# Patient Record
Sex: Male | Born: 2000 | Race: White | Hispanic: No | Marital: Single | Smoking: Never smoker
Health system: Southern US, Community
[De-identification: ages and names within clinical notes are randomized; demographics above are authoritative.]

## PROBLEM LIST (undated history)

## (undated) HISTORY — PX: SPLENECTOMY, TOTAL: SHX788

## (undated) HISTORY — PX: CHOLECYSTECTOMY: SHX55

## (undated) HISTORY — PX: TONSILLECTOMY: SUR1361

---
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
## 2018-12-16 ENCOUNTER — Emergency Department
Admission: EM | Admit: 2018-12-16 | Discharge: 2018-12-16 | Disposition: A | Discharge status: Home / Self Care | Payer: BC Managed Care – PPO | Attending: Emergency Medicine | Admitting: Emergency Medicine

## 2018-12-16 ENCOUNTER — Other Ambulatory Visit: Payer: Self-pay

## 2018-12-16 DIAGNOSIS — Z20828 Contact with and (suspected) exposure to other viral communicable diseases: Secondary | ICD-10-CM | POA: Insufficient documentation

## 2018-12-16 DIAGNOSIS — Z7189 Other specified counseling: Secondary | ICD-10-CM

## 2018-12-16 DIAGNOSIS — Z9081 Acquired absence of spleen: Secondary | ICD-10-CM | POA: Diagnosis not present

## 2018-12-16 DIAGNOSIS — Z1159 Encounter for screening for other viral diseases: Secondary | ICD-10-CM | POA: Diagnosis present

## 2018-12-16 NOTE — ED Triage Notes (Addendum)
Pt states he was at a part last night and was around a friend whose mother was tested positive for covid. 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 Pt currently does not have any sx.

## 2018-12-16 NOTE — ED Provider Notes (Signed)
[facility] Emergency Department Provider Note  ____________________________________________   None    (approximate)  I have reviewed the triage vital signs and the nursing notes.   HISTORY  Chief Complaint Covid test    HPI Alexander Vincent is a 18 y.o. male presents emergency department requesting a COVID-19 test.  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  He states he is not had any symptoms.  He does work in [employer].  He works for [employer].  He denies any chest pain or shortness of breath.    No past medical history on file.  There are no active problems to display for this patient.     Prior to Admission medications   Not on File    Allergies Zithromax [azithromycin]  No family history on file.  Social History Social History   Tobacco Use  . Smoking status: Never Smoker  . Smokeless tobacco: Never Used  Substance Use Topics  . Alcohol use: Yes  . Drug use: Not Currently    Review of Systems  Constitutional: No fever/chills Eyes: No visual changes. ENT: No sore throat. Respiratory: Denies cough Genitourinary: Negative for dysuria. Musculoskeletal: Negative for back pain. Skin: Negative for rash.    ____________________________________________   PHYSICAL EXAM:  VITAL SIGNS: ED Triage Vitals  Enc Vitals Group     BP 12/16/18 1702 (!) 147/86     Pulse Rate 12/16/18 1702 82     Resp 12/16/18 1702 18     Temp 12/16/18 1702 98.7 F (37.1 C)     Temp Source 12/16/18 1702 Oral     SpO2 12/16/18 1702 97 %     Weight 12/16/18 1703 (!) 325 lb (147.4 kg)     Height 12/16/18 1703 6' (1.829 m)     Head Circumference --      Peak Flow --      Pain Score 12/16/18 1651 0     Pain Loc --      Pain Edu? --      Excl. in [place]? --     Constitutional: Alert and oriented. Well appearing and in no acute distress. Eyes:  Conjunctivae are normal.  Head: Atraumatic. Nose: No congestion/rhinnorhea. Mouth/Throat: Mucous membranes are moist.   Neck:  supple no lymphadenopathy noted Cardiovascular: Normal rate, regular rhythm.  Respiratory: Normal respiratory effort.  No retractions,  GU: deferred Musculoskeletal: FROM all extremities, warm and well perfused Neurologic:  Normal speech and language.  Skin:  Skin is warm, dry and intact. No rash noted. Psychiatric: Mood and affect are normal. Speech and behavior are normal.  ____________________________________________   LABS (all labs ordered are listed, but only abnormal results are displayed)  Labs Reviewed  NOVEL CORONAVIRUS, NAA (HOSPITAL ORDER, SEND-OUT TO REF LAB)   ____________________________________________   ____________________________________________  RADIOLOGY    ____________________________________________   PROCEDURES  Procedure(s) performed: No  Procedures    ____________________________________________   INITIAL IMPRESSION / ASSESSMENT AND PLAN / ED COURSE  Pertinent labs & imaging results that were available during my care of the patient were reviewed by me and considered in my medical decision making (see chart for details).   Patient is 18 year old male presents emergency department requesting COVID-19 testing after being at a party for someone tested positive.  He states he is not have a spleen is very concerned.  He  has had no symptoms.  Physical exam patient appears well.  Basically unremarkable.  COVID-19 send out test ordered  Had a long lengthy discussion with the patient about his health status and his social recreation.  Explained to him that at this time with COVID-19 he would not have the ability to fight off the virus very well.  He should remain out of large crowds.  He should wear a mask when in public.  His responses "you only live once ".  Patient was given a work note to stay out of work until [employer]  testing is negative.  He states he understands my instructions.  He was discharged stable condition.     As part of my medical decision making, I reviewed the following data within the electronic MEDICAL RECORD NUMBER Nursing notes reviewed and incorporated, Old chart reviewed, Notes from prior ED visits and [place] Controlled Substance Database  ____________________________________________   FINAL CLINICAL IMPRESSION(S) / ED DIAGNOSES  Final diagnoses:  Educated About Covid-19 Virus Infection      NEW MEDICATIONS STARTED DURING THIS VISIT:  There are no discharge medications for this patient.    Note:  This document was prepared using Dragon voice recognition software and may include unintentional dictation errors.    Faythe Ghee, PA-C 12/16/18 1845    Emily Filbert, MD 12/16/18 2236

## 2018-12-16 NOTE — Discharge Instructions (Addendum)
STAY AT HOME UNTIL YOU HAVE A NEGATIVE TEST RESULT.  FOLLOW UP WITH THE HEALTH DEPARTMENT IF YOUR TEST IS POSITIVE.  ONCE YOU ARE NOT QUARANTINED, WEAR A MASK WHEN IN PUBLIC .  AVOID LARGE GATHERINGS

## 2018-12-18 LAB — NOVEL CORONAVIRUS, NAA (HOSP ORDER, SEND-OUT TO REF LAB; TAT 18-24 HRS): SARS-CoV-2, NAA: NOT DETECTED

## 2019-03-16 ENCOUNTER — Other Ambulatory Visit: Payer: Self-pay

## 2019-03-16 ENCOUNTER — Emergency Department
Admission: EM | Admit: 2019-03-16 | Discharge: 2019-03-16 | Disposition: A | Discharge status: Home / Self Care | Payer: BC Managed Care – PPO | Attending: Emergency Medicine | Admitting: Emergency Medicine

## 2019-03-16 DIAGNOSIS — R05 Cough: Secondary | ICD-10-CM | POA: Diagnosis not present

## 2019-03-16 DIAGNOSIS — R059 Cough, unspecified: Secondary | ICD-10-CM

## 2019-03-16 DIAGNOSIS — Z20828 Contact with and (suspected) exposure to other viral communicable diseases: Secondary | ICD-10-CM | POA: Insufficient documentation

## 2019-03-16 DIAGNOSIS — R509 Fever, unspecified: Secondary | ICD-10-CM | POA: Diagnosis not present

## 2019-03-16 DIAGNOSIS — R51 Headache: Secondary | ICD-10-CM | POA: Insufficient documentation

## 2019-03-16 DIAGNOSIS — R519 Headache, unspecified: Secondary | ICD-10-CM

## 2019-03-16 DIAGNOSIS — R0981 Nasal congestion: Secondary | ICD-10-CM | POA: Insufficient documentation

## 2019-03-16 MED ORDER — ZYRTEC ALLERGY 10 MG PO CAPS: 10.0000 mg | ORAL_CAPSULE | Freq: Every day | ORAL | 0 refills | Status: AC

## 2019-03-16 MED ORDER — FLUTICASONE PROPIONATE 50 MCG/ACT NA SUSP: 2.0000 | Freq: Every day | NASAL | 0 refills | Status: AC

## 2019-03-16 MED ORDER — DEXAMETHASONE SODIUM PHOSPHATE 10 MG/ML IJ SOLN
10.0000 mg | Freq: Once | INTRAMUSCULAR | Status: AC
  Administered 2019-03-16: 18:00:00 10 mg via INTRAMUSCULAR
  Filled 2019-03-16: qty 1

## 2019-03-16 NOTE — Discharge Instructions (Addendum)
You were seen today for headache, congestion and cough.  We have given you a steroid injection for your sinus pressure and headache.  I have given you prescriptions for Zyrtec and Flonase.  Can take ibuprofen or Tylenol as needed for headache.  We have tested you for COVID and will call you with those results.  You should remain in quarantine until you have been notified of the results.  Work note provided.

## 2019-03-16 NOTE — ED Triage Notes (Signed)
Pt presents via POV c/o headache, cough, congestion x2 days. Reports temp 99.8 at home.

## 2019-03-16 NOTE — ED Notes (Signed)
Pt verbalized understanding of discharge instructions. NAD at this time. 

## 2019-03-16 NOTE — ED Provider Notes (Signed)
[facility] Emergency Department Provider Note ____________________________________________  Time seen: 1726  I have reviewed the triage vital signs and the nursing notes.  HISTORY  Chief Complaint  Nasal Congestion and Cough   HPI Alexander Vincent is a 18 y.o. male presents to the clinic today with c/o headache, nasal congestion and cough. Symptoms started yesterday.  The headache is located in his forehead.  He describes the pain as pressure.  He denies associated dizziness or visual changes.  He is blowing clear/yellow mucus out of his nose.  He reports scratchy throat but no sore throat.  The cough is productive of clear/yellow mucus.  He denies shortness of breath.  He reports low-grade fever but denies chills or body aches.  He has had allergies in the past.  He denies exposure to COVID that he is aware of.  He has taken Alka-Seltzer with minimal relief.  History reviewed. No pertinent past medical history.  There are no active problems to display for this patient.   Past Surgical History:  Procedure Laterality Date  . SPLENECTOMY, TOTAL      Prior to Admission medications   Medication Sig Start Date End Date Taking? Authorizing Provider  Cetirizine HCl (ZYRTEC ALLERGY) 10 MG CAPS Take 1 capsule (10 mg total) by mouth daily. 03/16/19   Jearld Fenton, NP  fluticasone (FLONASE) 50 MCG/ACT nasal spray Place 2 sprays into both nostrils daily. 03/16/19   Jearld Fenton, NP    Allergies Zithromax [azithromycin]  History reviewed. No pertinent family history.  Social History Social History   Tobacco Use  . Smoking status: Never Smoker  . Smokeless tobacco: Never Used  Substance Use Topics  . Alcohol use: Yes  . Drug use: Not Currently    Review of Systems  Constitutional: Positive for fever.  Denies chills or body aches ENT: Positive for nasal congestion.  Negative for runny nose, ear pain or sore throat. Cardiovascular: Negative for  chest pain or chest tightness. Respiratory: Positive for cough.  Negative for shortness of breath. Gastrointestinal: Negative for abdominal pain, nausea, vomiting and diarrhea. Skin: Negative for rash. Neurological: Positive for headache.  Negative for  focal weakness or numbness. ____________________________________________  PHYSICAL EXAM:  VITAL SIGNS: ED Triage Vitals  Enc Vitals Group     BP 03/16/19 1705 (!) 159/93     Pulse Rate 03/16/19 1705 (!) 113     Resp 03/16/19 1705 14     Temp 03/16/19 1705 98.9 F (37.2 C)     Temp Source 03/16/19 1705 Oral     SpO2 03/16/19 1705 95 %     Weight 03/16/19 1706 (!) 330 lb (149.7 kg)     Height 03/16/19 1706 6' (1.829 m)     Head Circumference --      Peak Flow --      Pain Score 03/16/19 1706 6     Pain Loc --      Pain Edu? --      Excl. in [place]? --     Constitutional: Alert and oriented. Well appearing and in no distress. Head: Normocephalic, no frontal or maxillary sinus tenderness noted. Eyes: Conjunctivae are normal. PERRL. Normal extraocular movements Ears: Canals clear. TMs intact bilaterally. Nose: Turbinates erythematous. Mouth/Throat: Mucous membranes are moist.  + PND noted. Hematological/Lymphatic/Immunological: No cervical lymphadenopathy. Cardiovascular: Normal rate, regular rhythm.  Respiratory: Normal respiratory effort. No wheezes/rales/rhonchi. Neurologic:  Normal gait without ataxia. Normal speech and language. No gross focal neurologic deficits are  appreciated. Skin:  Skin is warm, dry and intact. No rash noted. ________________________________   LABS  Lab Orders     SARS CORONAVIRUS 2 Nasal Swab Aptima Multi Swab  ____________________________________________    INITIAL IMPRESSION / ASSESSMENT AND PLAN / ED COURSE  Fever, Sinus Headache, Nasal Congestion and Cough:  Likely viral sinusitis, allergies vs COVID Decadron 10 mg IM today Rx for Flonase 1 spray twice daily x3 days then daily  thereafter Rx for Zyrtec 10 mg daily x1 week Test in 4 COVID completed in ER, will call you with results ____________________________________________  FINAL CLINICAL IMPRESSION(S) / ED DIAGNOSES  Final diagnoses:  Sinus headache  Nasal congestion  Cough  Fever, unspecified fever cause   Nicki Reaperegina Baity, NP    Lorre MunroeBaity, Regina W, NP 03/16/19 1745    Dionne BucySiadecki, Sebastian, MD 03/17/19 [phone]

## 2019-03-17 LAB — SARS CORONAVIRUS 2 (TAT 6-24 HRS): SARS Coronavirus 2: NEGATIVE

## 2019-06-16 ENCOUNTER — Other Ambulatory Visit: Payer: Self-pay

## 2019-06-16 DIAGNOSIS — R1110 Vomiting, unspecified: Secondary | ICD-10-CM | POA: Diagnosis present

## 2019-06-16 DIAGNOSIS — Z79899 Other long term (current) drug therapy: Secondary | ICD-10-CM | POA: Insufficient documentation

## 2019-06-16 DIAGNOSIS — K529 Noninfective gastroenteritis and colitis, unspecified: Secondary | ICD-10-CM | POA: Insufficient documentation

## 2019-06-16 DIAGNOSIS — R1013 Epigastric pain: Secondary | ICD-10-CM | POA: Insufficient documentation

## 2019-06-16 LAB — COMPREHENSIVE METABOLIC PANEL
ALT: 52 U/L — ABNORMAL HIGH (ref 0–44)
AST: 29 U/L (ref 15–41)
Albumin: 4.7 g/dL (ref 3.5–5.0)
Alkaline Phosphatase: 46 U/L (ref 38–126)
Anion gap: 9 (ref 5–15)
BUN: 13 mg/dL (ref 6–20)
CO2: 24 mmol/L (ref 22–32)
Calcium: 9.5 mg/dL (ref 8.9–10.3)
Chloride: 108 mmol/L (ref 98–111)
Creatinine, Ser: 0.79 mg/dL (ref 0.61–1.24)
GFR calc Af Amer: >60 mL/min (ref >60)
GFR calc non Af Amer: >60 mL/min (ref >60)
Glucose, Bld: 118 mg/dL — ABNORMAL HIGH (ref 70–99)
Potassium: 3.8 mmol/L (ref 3.5–5.1)
Sodium: 141 mmol/L (ref 135–145)
Total Bilirubin: 0.8 mg/dL (ref 0.3–1.2)
Total Protein: 7.8 g/dL (ref 6.5–8.1)

## 2019-06-16 LAB — URINALYSIS, COMPLETE (UACMP) WITH MICROSCOPIC
Bacteria, UA: NONE SEEN
Bilirubin Urine: NEGATIVE
Glucose, UA: NEGATIVE mg/dL
Hgb urine dipstick: NEGATIVE
Ketones, ur: NEGATIVE mg/dL
Leukocytes,Ua: NEGATIVE
Nitrite: NEGATIVE
Protein, ur: NEGATIVE mg/dL
Specific Gravity, Urine: 1.025 (ref 1.005–1.030)
pH: 5 (ref 5.0–8.0)

## 2019-06-16 LAB — CBC
HCT: 45.2 % (ref 39.0–52.0)
Hemoglobin: 16.2 g/dL (ref 13.0–17.0)
MCH: 30.3 pg (ref 26.0–34.0)
MCHC: 35.8 g/dL (ref 30.0–36.0)
MCV: 84.5 fL (ref 80.0–100.0)
Platelets: 620 10*3/uL — ABNORMAL HIGH (ref 150–400)
RBC: 5.35 MIL/uL (ref 4.22–5.81)
RDW: 12.6 % (ref 11.5–15.5)
WBC: 12.2 10*3/uL — ABNORMAL HIGH (ref 4.0–10.5)
nRBC: 0 % (ref 0.0–0.2)

## 2019-06-16 LAB — LIPASE, BLOOD: Lipase: 26 U/L (ref 11–51)

## 2019-06-16 NOTE — ED Triage Notes (Signed)
Pt states vomiting and diarrhea with generalized abd pain since 1400 today. Pt denies known fever. Pt with emesis x4 and diarrhea x2. Pt in no acute distress.

## 2019-06-17 ENCOUNTER — Emergency Department
Admission: EM | Admit: 2019-06-17 | Discharge: 2019-06-17 | Disposition: A | Discharge status: Home / Self Care | Payer: BC Managed Care – PPO | Attending: Emergency Medicine | Admitting: Emergency Medicine

## 2019-06-17 DIAGNOSIS — K529 Noninfective gastroenteritis and colitis, unspecified: Secondary | ICD-10-CM

## 2019-06-17 MED ORDER — ONDANSETRON 4 MG PO TBDP: ORAL_TABLET | ORAL | 0 refills | Status: AC

## 2019-06-17 MED ORDER — ONDANSETRON 4 MG PO TBDP
ORAL_TABLET | ORAL | Status: AC
  Administered 2019-06-17: 4 mg via ORAL
  Filled 2019-06-17: qty 1

## 2019-06-17 MED ORDER — ONDANSETRON 4 MG PO TBDP
4.0000 mg | ORAL_TABLET | Freq: Once | ORAL | Status: AC
  Administered 2019-06-17: 05:00:00 4 mg via ORAL

## 2019-06-17 NOTE — ED Provider Notes (Signed)
[facility] Emergency Department Provider Note  ____________________________________________   First MD Initiated Contact with Patient 06/17/19 [phone]     (approximate)  I have reviewed the triage vital signs and the nursing notes.   HISTORY  Chief Complaint Emesis and Diarrhea    HPI Alexander Vincent is a 18 y.o. male with history of obesity but otherwise no chronic medical issues.  He presents for evaluation after multiple episodes of vomiting and diarrhea earlier today.  He says that the symptoms started soon as he woke up this morning.   He says the symptoms have been severe but intermittent.  He was feeling a little bit better after coming to the emergency department earlier this afternoon so he decided to leave without being seen.  However he went home and ate some [place] food and immediately threw up again so he came back to the ED.  He has been waiting in the lobby for almost 8 hours and has had no more vomiting.  He had a "semisoft" bowel movement.  He is not really having any pain except for some discomfort in his epigastrium that is worse when he vomits.  He denies fever/chills, sore throat, chest pain, cough, shortness of breath.  No dysuria.  No one else in the family has had symptoms.  No alcohol use.        No past medical history on file.  There are no active problems to display for this patient.   Past Surgical History:  Procedure Laterality Date  . SPLENECTOMY, TOTAL      Prior to Admission medications   Medication Sig Start Date End Date Taking? Authorizing Provider  Cetirizine HCl (ZYRTEC ALLERGY) 10 MG CAPS Take 1 capsule (10 mg total) by mouth daily. 03/16/19   Jearld Fenton, NP  fluticasone (FLONASE) 50 MCG/ACT nasal spray Place 2 sprays into both nostrils daily. 03/16/19   Jearld Fenton, NP  ondansetron (ZOFRAN ODT) 4 MG disintegrating tablet Allow 1-2 tablets to dissolve in your mouth every 8 hours as needed for  nausea/vomiting 06/17/19   Hinda Kehr, MD    Allergies Zithromax [azithromycin]  No family history on file.  Social History Social History   Tobacco Use  . Smoking status: Never Smoker  . Smokeless tobacco: Never Used  Substance Use Topics  . Alcohol use: Yes  . Drug use: Not Currently    Review of Systems Constitutional: No fever/chills Eyes: No visual changes. ENT: No sore throat. Cardiovascular: Denies chest pain. Respiratory: Denies shortness of breath. Gastrointestinal: Nausea, vomiting, and diarrhea.  Some epigastric discomfort associated with the vomiting. Genitourinary: Negative for dysuria. Musculoskeletal: Negative for neck pain.  Negative for back pain. Integumentary: Negative for rash. Neurological: Negative for headaches, focal weakness or numbness.   ____________________________________________   PHYSICAL EXAM:  VITAL SIGNS: ED Triage Vitals [06/16/19 2058]  Enc Vitals Group     BP (!) 179/93     Pulse Rate 100     Resp 18     Temp 99 F (37.2 C)     Temp Source Oral     SpO2 100 %     Weight (!) 156.5 kg (345 lb)     Height 1.829 m (6')     Head Circumference      Peak Flow      Pain Score 4     Pain Loc      Pain Edu?      Excl. in [place]?  Constitutional: Alert and oriented.  Well-appearing and in no distress, ambulating without any difficulty. Eyes: Conjunctivae are normal.  Head: Atraumatic. Nose: No congestion/rhinnorhea. Mouth/Throat: Patient is wearing a mask. Neck: No stridor.  No meningeal signs.   Cardiovascular: Normal rate, regular rhythm. Good peripheral circulation. Grossly normal heart sounds. Respiratory: Normal respiratory effort.  No retractions. Gastrointestinal: Obese.  Soft and nondistended abdomen.  Mild tenderness to palpation of the epigastrium, no right upper quadrant tenderness, negative Murphy sign.  No lower abdominal tenderness.  No rebound and no guarding. Musculoskeletal: No lower extremity tenderness  nor edema. No gross deformities of extremities. Neurologic:  Normal speech and language. No gross focal neurologic deficits are appreciated.  Skin:  Skin is warm, dry and intact. Psychiatric: Mood and affect are normal. Speech and behavior are normal.  ____________________________________________   LABS (all labs ordered are listed, but only abnormal results are displayed)  Labs Reviewed  COMPREHENSIVE METABOLIC PANEL - Abnormal; Notable for the following components:      Result Value   Glucose, Bld 118 (*)    ALT 52 (*)    All other components within normal limits  CBC - Abnormal; Notable for the following components:   WBC 12.2 (*)    Platelets 620 (*)    All other components within normal limits  URINALYSIS, COMPLETE (UACMP) WITH MICROSCOPIC - Abnormal; Notable for the following components:   Color, Urine YELLOW (*)    APPearance CLEAR (*)    All other components within normal limits  LIPASE, BLOOD   ____________________________________________  EKG  None - EKG not ordered by ED physician ____________________________________________  RADIOLOGY Marylou MccoyI, Kayra Crowell, personally viewed and evaluated these images (plain radiographs) as part of my medical decision making, as well as reviewing the written report by the radiologist.  ED MD interpretation: No indication for emergent imaging  Official radiology report(s): No results found.  ____________________________________________   PROCEDURES   Procedure(s) performed (including Critical Care):  Procedures   ____________________________________________   INITIAL IMPRESSION / MDM / ASSESSMENT AND PLAN / ED COURSE  As part of my medical decision making, I reviewed the following data within the electronic MEDICAL RECORD NUMBER Nursing notes reviewed and incorporated, Labs reviewed , Old chart reviewed and Notes from prior ED visits   Differential diagnosis includes, but is not limited to, viral gastroenteritis, foodborne  pathogen, acute intra-abdominal infection such as appendicitis, biliary colic or cholecystitis, diverticulitis.  The patient is well-appearing and in no distress.  He has no tachycardia and is ambulating without difficulty.  Afebrile, well-appearing and in no distress.  Minimal tenderness to palpation of the epigastrium likely related to his multiple episodes of vomiting but no right upper quadrant tenderness and negative Murphy sign.  Very mild leukocytosis of 12 which is nonspecific particularly in a 18 year old, and he has normal LFTs and a normal lipase.  He is tolerating oral intake in the ED after Zofran 4 mg ODT and is appropriate for discharge and outpatient follow-up.  No indication for emergent imaging.  I am prescribing Zofran and I had my usual customary gastroenteritis discussion and return precautions.  He understands and agrees with the plan.          ____________________________________________  FINAL CLINICAL IMPRESSION(S) / ED DIAGNOSES  Final diagnoses:  Gastroenteritis     MEDICATIONS GIVEN DURING THIS VISIT:  Medications  ondansetron (ZOFRAN-ODT) disintegrating tablet 4 mg (4 mg Oral Given 06/17/19 0455)     ED Discharge Orders  Ordered    ondansetron (ZOFRAN ODT) 4 MG disintegrating tablet     06/17/19 0449          *Please note:  Alexander Vincent was evaluated in Emergency Department on 06/17/2019 for the symptoms described in the history of present illness. He was evaluated in the context of the global COVID-19 pandemic, which necessitated consideration that the patient might be at risk for infection with the SARS-CoV-2 virus that causes COVID-19. Institutional protocols and algorithms that pertain to the evaluation of patients at risk for COVID-19 are in a state of rapid change based on information released by regulatory bodies including the CDC and federal and state organizations. These policies and algorithms were followed during the  patient's care in the ED.  Some ED evaluations and interventions may be delayed as a result of limited staffing during the pandemic.*  Note:  This document was prepared using Dragon voice recognition software and may include unintentional dictation errors.   Loleta Rose, MD 06/17/19 [phone]

## 2019-06-17 NOTE — ED Notes (Signed)
No peripheral IV placed this visit.   Discharge instructions reviewed with patient. Questions fielded by this RN. Patient verbalizes understanding of instructions. Patient discharged home in stable condition per forbach. No acute distress noted at time of discharge.   

## 2019-06-17 NOTE — ED Notes (Addendum)
ED Provider at bedside.  Pt c/o of N and V x 3, D x 3 that started yesterday at 54 when pt woke (pt works 3rd shift at [employer]), pt denies chronic med issues or exposure to others that are sick, others in household ate same food without feeling ill  Pain to moderate palpation at epigastric area  Pt coached on bland diet and resting GI system

## 2019-06-17 NOTE — Discharge Instructions (Signed)

## 2019-06-17 NOTE — ED Notes (Signed)
Family at bedside. 

## 2022-02-02 ENCOUNTER — Telehealth: Payer: Self-pay

## 2022-02-02 NOTE — Telephone Encounter (Signed)
Called pt to assist with scheduling a PCP under the Managed Medicaid program. Left message to call back to [phone].

## 2022-06-25 DIAGNOSIS — R1013 Epigastric pain: Secondary | ICD-10-CM | POA: Diagnosis not present

## 2022-06-25 DIAGNOSIS — R112 Nausea with vomiting, unspecified: Secondary | ICD-10-CM | POA: Diagnosis not present

## 2022-06-25 DIAGNOSIS — R197 Diarrhea, unspecified: Secondary | ICD-10-CM | POA: Diagnosis not present

## 2022-10-28 ENCOUNTER — Emergency Department
Admission: EM | Admit: 2022-10-28 | Discharge: 2022-10-28 | Disposition: A | Discharge status: Home / Self Care | Payer: Medicaid Other | Attending: Emergency Medicine | Admitting: Emergency Medicine

## 2022-10-28 ENCOUNTER — Emergency Department: Payer: Medicaid Other

## 2022-10-28 ENCOUNTER — Other Ambulatory Visit: Payer: Self-pay

## 2022-10-28 DIAGNOSIS — K8689 Other specified diseases of pancreas: Secondary | ICD-10-CM | POA: Diagnosis not present

## 2022-10-28 DIAGNOSIS — M5459 Other low back pain: Secondary | ICD-10-CM | POA: Diagnosis not present

## 2022-10-28 DIAGNOSIS — D72829 Elevated white blood cell count, unspecified: Secondary | ICD-10-CM | POA: Insufficient documentation

## 2022-10-28 DIAGNOSIS — N390 Urinary tract infection, site not specified: Secondary | ICD-10-CM | POA: Diagnosis not present

## 2022-10-28 DIAGNOSIS — R1032 Left lower quadrant pain: Secondary | ICD-10-CM | POA: Diagnosis present

## 2022-10-28 DIAGNOSIS — M545 Low back pain, unspecified: Secondary | ICD-10-CM

## 2022-10-28 LAB — URINALYSIS, ROUTINE W REFLEX MICROSCOPIC
Bacteria, UA: NONE SEEN
Bilirubin Urine: NEGATIVE
Glucose, UA: NEGATIVE mg/dL
Hgb urine dipstick: NEGATIVE
Ketones, ur: 5 mg/dL — AB
Nitrite: NEGATIVE
Protein, ur: NEGATIVE mg/dL
Specific Gravity, Urine: 1.03 (ref 1.005–1.030)
pH: 5 (ref 5.0–8.0)

## 2022-10-28 LAB — CBC
HCT: 47.9 % (ref 39.0–52.0)
Hemoglobin: 16.8 g/dL (ref 13.0–17.0)
MCH: 30.5 pg (ref 26.0–34.0)
MCHC: 35.1 g/dL (ref 30.0–36.0)
MCV: 86.9 fL (ref 80.0–100.0)
Platelets: 572 10*3/uL — ABNORMAL HIGH (ref 150–400)
RBC: 5.51 MIL/uL (ref 4.22–5.81)
RDW: 12.2 % (ref 11.5–15.5)
WBC: 11.2 10*3/uL — ABNORMAL HIGH (ref 4.0–10.5)
nRBC: 0 % (ref 0.0–0.2)

## 2022-10-28 LAB — BASIC METABOLIC PANEL
Anion gap: 10 (ref 5–15)
BUN: 14 mg/dL (ref 6–20)
CO2: 22 mmol/L (ref 22–32)
Calcium: 9.6 mg/dL (ref 8.9–10.3)
Chloride: 108 mmol/L (ref 98–111)
Creatinine, Ser: 0.89 mg/dL (ref 0.61–1.24)
GFR, Estimated: >60 mL/min (ref >60)
Glucose, Bld: 87 mg/dL (ref 70–99)
Potassium: 3.7 mmol/L (ref 3.5–5.1)
Sodium: 140 mmol/L (ref 135–145)

## 2022-10-28 LAB — HEPATIC FUNCTION PANEL
ALT: 48 U/L — ABNORMAL HIGH (ref 0–44)
AST: 31 U/L (ref 15–41)
Albumin: 5.1 g/dL — ABNORMAL HIGH (ref 3.5–5.0)
Alkaline Phosphatase: 41 U/L (ref 38–126)
Bilirubin, Direct: 0.2 mg/dL (ref 0.0–0.2)
Indirect Bilirubin: 1.3 mg/dL — ABNORMAL HIGH (ref 0.3–0.9)
Total Bilirubin: 1.5 mg/dL — ABNORMAL HIGH (ref 0.3–1.2)
Total Protein: 8.1 g/dL (ref 6.5–8.1)

## 2022-10-28 LAB — CHLAMYDIA/NGC RT PCR (ARMC ONLY)
Chlamydia Tr: NOT DETECTED
N gonorrhoeae: NOT DETECTED

## 2022-10-28 LAB — LIPASE, BLOOD: Lipase: 23 U/L (ref 11–51)

## 2022-10-28 MED ORDER — HYDROCODONE-ACETAMINOPHEN 5-325 MG PO TABS: 2.0000 | ORAL_TABLET | Freq: Four times a day (QID) | ORAL | 0 refills | Status: AC | PRN

## 2022-10-28 MED ORDER — CEPHALEXIN 500 MG PO CAPS: 500.0000 mg | ORAL_CAPSULE | Freq: Two times a day (BID) | ORAL | 0 refills | Status: AC

## 2022-10-28 MED ORDER — KETOROLAC TROMETHAMINE 30 MG/ML IJ SOLN
60.0000 mg | Freq: Once | INTRAMUSCULAR | Status: AC
  Administered 2022-10-28: 60 mg via INTRAMUSCULAR
  Filled 2022-10-28: qty 2

## 2022-10-28 MED ORDER — ONDANSETRON 4 MG PO TBDP: 4.0000 mg | ORAL_TABLET | Freq: Once | ORAL | Status: DC

## 2022-10-28 MED ORDER — IBUPROFEN 800 MG PO TABS: 800.0000 mg | ORAL_TABLET | Freq: Three times a day (TID) | ORAL | 0 refills | Status: AC | PRN

## 2022-10-28 MED ORDER — CEPHALEXIN 500 MG PO CAPS
500.0000 mg | ORAL_CAPSULE | Freq: Once | ORAL | Status: AC
  Administered 2022-10-28: 500 mg via ORAL
  Filled 2022-10-28: qty 1

## 2022-10-28 NOTE — Discharge Instructions (Addendum)
Please go to the following website to schedule new (and existing) patient appointments:   [email or web address]   The following is a list of primary care offices in the area who are accepting new patients at this time.  Please reach out to one of them directly and let them know you would like to schedule an appointment to follow up on an Emergency Department visit, and/or to establish a new primary care provider (PCP).  There are likely other primary care clinics in the are who are accepting new patients, but this is an excellent place to start:  [facility] Lead physician: Dr Angela Bacigalupo [address] [place], [place] [address] [phone]  [facility] Lead Physician: Dr Krichna Sowles [address], [place], [place] [address] [phone]  [facility]  Lead Physician: Dr Megan Johnson [address], [place], [place] [address] [phone]  [facility] [place] [facility] Lead Physician: Dr Alex Karamalegos [address], [place], [place] [address] [phone]  [facility] [place] Lead Physician: Dr Laura Berglund [address], [place],  [address] [phone]   

## 2022-10-28 NOTE — ED Triage Notes (Addendum)
Pt to ED via POV c/o left sided flank pain. Pt reports pain started around 0430pm today. Pt endorses some nausea. Denies any urinary sx, vomiting, diarrhea, CP, SOB. NAD at this time

## 2022-10-28 NOTE — ED Notes (Signed)
Pt provided a diet soda.

## 2022-10-28 NOTE — ED Provider Notes (Signed)
[facility] Provider Note    Event Date/Time   First MD Initiated Contact with Patient 10/28/22 0131     (approximate)   History   Flank Pain   HPI  Alexander Vincent is a 22 y.o. male with history of cholecystectomy, splenectomy who presents to the emergency department left lower abdominal pain with nausea.  Pain progressively worsening.  Feels like he is having hard time urinating but no dysuria or hematuria.  No penile discharge.  No previous history of kidney stone.  No vomiting, diarrhea.  No testicular pain or swelling.  There is some pain with movement but he denies any known injury.  No numbness, tingling, weakness, incontinence.  No fever.   History provided by patient.    History reviewed. No pertinent past medical history.  Past Surgical History:  Procedure Laterality Date   CHOLECYSTECTOMY     SPLENECTOMY, TOTAL     TONSILLECTOMY      MEDICATIONS:  Prior to Admission medications   Medication Sig Start Date End Date Taking? Authorizing Provider  Cetirizine HCl (ZYRTEC ALLERGY) 10 MG CAPS Take 1 capsule (10 mg total) by mouth daily. 03/16/19   Lorre MunroeBaity, Regina W, NP  fluticasone (FLONASE) 50 MCG/ACT nasal spray Place 2 sprays into both nostrils daily. 03/16/19   Lorre MunroeBaity, Regina W, NP  ondansetron (ZOFRAN ODT) 4 MG disintegrating tablet Allow 1-2 tablets to dissolve in your mouth every 8 hours as needed for nausea/vomiting 06/17/19   Loleta RoseForbach, Cory, MD    Physical Exam   Triage Vital Signs: ED Triage Vitals [10/28/22 0100]  Enc Vitals Group     BP (!) 141/88     Pulse Rate 85     Resp 18     Temp 98.2 F (36.8 C)     Temp Source Oral     SpO2 96 %     Weight (!) 306 lb (138.8 kg)     Height 6' (1.829 m)     Head Circumference      Peak Flow      Pain Score 3     Pain Loc      Pain Edu?      Excl. in GC?     Most recent vital signs: Vitals:   10/28/22 0100 10/28/22 0405  BP: (!) 141/88 (!) 145/79  Pulse: 85 89  Resp:  18 20  Temp: 98.2 F (36.8 C) 98.1 F (36.7 C)  SpO2: 96% 98%    CONSTITUTIONAL: Alert, responds appropriately to questions. Well-appearing; well-nourished HEAD: Normocephalic, atraumatic EYES: Conjunctivae clear, pupils appear equal, sclera nonicteric ENT: normal nose; moist mucous membranes NECK: Supple, normal ROM CARD: RRR; S1 and S2 appreciated RESP: Normal chest excursion without splinting or tachypnea; breath sounds clear and equal bilaterally; no wheezes, no rhonchi, no rales, no hypoxia or respiratory distress, speaking full sentences ABD/GI: Non-distended; soft, non-tender, no rebound, no guarding, no peritoneal signs BACK: The back appears normal, no CVA tenderness, no pain with palpation of the left paraspinal muscles, no midline spinal tenderness or step-off or deformity EXT: Normal ROM in all joints; no deformity noted, no edema SKIN: Normal color for age and race; warm; no rash on exposed skin NEURO: Moves all extremities equally, normal speech normal gait, no saddle anesthesia PSYCH: The patient's mood and manner are appropriate.   ED Results / Procedures / Treatments   LABS: (all labs ordered are listed, but only abnormal results are displayed) Labs Reviewed  URINALYSIS, ROUTINE W REFLEX  MICROSCOPIC - Abnormal; Notable for the following components:      Result Value   Color, Urine YELLOW (*)    APPearance HAZY (*)    Ketones, ur 5 (*)    Leukocytes,Ua TRACE (*)    All other components within normal limits  CBC - Abnormal; Notable for the following components:   WBC 11.2 (*)    Platelets 572 (*)    All other components within normal limits  HEPATIC FUNCTION PANEL - Abnormal; Notable for the following components:   Albumin 5.1 (*)    ALT 48 (*)    Total Bilirubin 1.5 (*)    Indirect Bilirubin 1.3 (*)    All other components within normal limits  CHLAMYDIA/NGC RT PCR ([facility] ONLY)            URINE CULTURE  BASIC METABOLIC PANEL  LIPASE, BLOOD     EKG:   EKG Interpretation  Date/Time:    Ventricular Rate:    PR Interval:    QRS Duration:   QT Interval:    QTC Calculation:   R Axis:     Text Interpretation:           RADIOLOGY: My personal review and interpretation of imaging: CT scan shows no kidney stone.  I have personally reviewed all radiology reports.   CT Renal Stone Study  Result Date: 10/28/2022 CLINICAL DATA:  Abdominal pain.  Concern for kidney stone. EXAM: CT ABDOMEN AND PELVIS WITHOUT CONTRAST TECHNIQUE: Multidetector CT imaging of the abdomen and pelvis was performed following the standard protocol without IV contrast. RADIATION DOSE REDUCTION: This exam was performed according to the departmental dose-optimization program which includes automated exposure control, adjustment of the mA and/or kV according to patient size and/or use of iterative reconstruction technique. COMPARISON:  None Available. FINDINGS: Evaluation of this exam is limited in the absence of intravenous contrast. Lower chest: The visualized lung bases are clear. No intra-abdominal free air or free fluid. Hepatobiliary: The liver is unremarkable. No BD dilatation. Cholecystectomy. Pancreas: Mild haziness of the uncinate process of the pancreas. Correlation with pancreatic enzymes recommended to evaluate for possibility of acute pancreatitis. No drainable fluid collection/abscess. No dilatation of the main pancreatic duct or gland atrophy. Spleen: Splenectomy. Small rounded soft tissue nodule in the left upper abdomen, likely residual splenic tissue. Adrenals/Urinary Tract: The adrenal glands are unremarkable. The kidneys, visualized ureters, and urinary bladder appear unremarkable. Stomach/Bowel: There is no bowel obstruction or active inflammation. The appendix is normal. Vascular/Lymphatic: The abdominal aorta and IVC are grossly unremarkable on this noncontrast CT. No portal venous gas. There is no adenopathy. Reproductive: The prostate and seminal vesicles are  grossly unremarkable. No pelvic mass. Other: None Musculoskeletal: Bilateral L5 pars defects. No listhesis. No acute osseous pathology. IMPRESSION: 1. No hydronephrosis or nephrolithiasis. 2. Mild haziness of the uncinate process of the pancreas. Correlation with pancreatic enzymes recommended to evaluate for possibility of acute pancreatitis. 3. No bowel obstruction. Normal appendix. Electronically Signed   By: Elgie CollardArash  Radparvar M.D.   On: 10/28/2022 01:45     PROCEDURES:  Critical Care performed: No     Procedures    IMPRESSION / MDM / ASSESSMENT AND PLAN / ED COURSE  I reviewed the triage vital signs and the nursing notes.    Here with left lower back pain.  The patient is on the cardiac monitor to evaluate for evidence of arrhythmia and/or significant heart rate changes.   DIFFERENTIAL DIAGNOSIS (includes but not limited to):  Kidney stone, UTI, pyelonephritis, musculoskeletal back pain, doubt cauda equina, epidural abscess or hematoma, discitis or osteomyelitis, fracture   Patient's presentation is most consistent with acute complicated illness / injury requiring diagnostic workup.   PLAN: Will obtain abdominal labs, urine, CT renal study.  He drove himself to the emergency department.  Will give Toradol for pain, Zofran for nausea.   MEDICATIONS GIVEN IN ED: Medications  ondansetron (ZOFRAN-ODT) disintegrating tablet 4 mg (4 mg Oral Patient Refused/Not Given 10/28/22 0158)  ketorolac (TORADOL) 30 MG/ML injection 60 mg (60 mg Intramuscular Given 10/28/22 0148)  cephALEXin (KEFLEX) capsule 500 mg (500 mg Oral Given 10/28/22 0357)     ED COURSE: Patient's labs show leukocytosis of 11.2.  Normal creatinine.  ALT, total bilirubin just minimally elevated.  Normal lipase.  He is not having any abdominal tenderness on exam.  CT renal study shows no kidney stone, hydronephrosis.  He did have some mild haziness around the pancreas but again no tenderness in the abdomen and lipase is  normal.  Low suspicion clinically for acute pancreatitis.  Appendix appears normal on CT scan.  Urine does show some pyuria and trace leukocyte Estrace but no bacteria.  Will add on a urine culture.  Also added on urine gonorrhea and chlamydia which are negative today.  Discussed patient that this could be early UTI but also likely musculoskeletal back pain.  Will discharge with antibiotics, anti-inflammatories and pain medication.  Patient comfortable with this plan.  Will provide with work note.   At this time, I do not feel there is any life-threatening condition present. I reviewed all nursing notes, vitals, pertinent previous records.  All lab and urine results, EKGs, imaging ordered have been independently reviewed and interpreted by myself.  I reviewed all available radiology reports from any imaging ordered this visit.  Based on my assessment, I feel the patient is safe to be discharged home without further emergent workup and can continue workup as an outpatient as needed. Discussed all findings, treatment plan as well as usual and customary return precautions.  They verbalize understanding and are comfortable with this plan.  Outpatient follow-up has been provided as needed.  All questions have been answered.    CONSULTS:  none   OUTSIDE RECORDS REVIEWED: Reviewed last family medicine visit in 2018.       FINAL CLINICAL IMPRESSION(S) / ED DIAGNOSES   Final diagnoses:  Acute left-sided low back pain without sciatica  Acute UTI     Rx / DC Orders   ED Discharge Orders          Ordered    cephALEXin (KEFLEX) 500 MG capsule  2 times daily        10/28/22 0325    ibuprofen (ADVIL) 800 MG tablet  Every 8 hours PRN        10/28/22 0325    HYDROcodone-acetaminophen (NORCO/VICODIN) 5-325 MG tablet  Every 6 hours PRN        10/28/22 0325             Note:  This document was prepared using Dragon voice recognition software and may include unintentional dictation errors.    Tywan Siever, Layla Maw, DO 10/28/22 [phone]

## 2022-10-28 NOTE — ED Notes (Signed)
E-signature pad unavailable - Pt verbalized understanding of D/C information - no additional concerns at this time.  

## 2022-10-29 LAB — URINE CULTURE: Culture: NO GROWTH

## 2023-07-15 DIAGNOSIS — J069 Acute upper respiratory infection, unspecified: Secondary | ICD-10-CM | POA: Diagnosis not present

## 2023-11-06 DIAGNOSIS — W540XXA Bitten by dog, initial encounter: Secondary | ICD-10-CM | POA: Diagnosis not present

## 2023-11-06 DIAGNOSIS — Z23 Encounter for immunization: Secondary | ICD-10-CM | POA: Diagnosis not present

## 2023-11-06 DIAGNOSIS — S61256A Open bite of right little finger without damage to nail, initial encounter: Secondary | ICD-10-CM | POA: Diagnosis not present
